# Patient Record
Sex: Female | Born: 1970 | Race: White | Hispanic: No | Marital: Married | Smoking: Never smoker
Health system: Southern US, Community
[De-identification: ages and names within clinical notes are randomized; demographics above are authoritative.]

## PROBLEM LIST (undated history)

## (undated) DIAGNOSIS — I10 Essential (primary) hypertension: Secondary | ICD-10-CM

## (undated) DIAGNOSIS — Z973 Presence of spectacles and contact lenses: Secondary | ICD-10-CM

## (undated) HISTORY — PX: KIDNEY STONE SURGERY: SHX686

## (undated) HISTORY — DX: Essential (primary) hypertension: I10

## (undated) HISTORY — PX: TONSILLECTOMY: SUR1361

## (undated) HISTORY — PX: TUBAL LIGATION: SHX77

---

## 2013-02-17 ENCOUNTER — Other Ambulatory Visit: Payer: Self-pay | Admitting: Nurse Practitioner

## 2013-02-17 DIAGNOSIS — N6320 Unspecified lump in the left breast, unspecified quadrant: Secondary | ICD-10-CM

## 2013-02-25 ENCOUNTER — Ambulatory Visit
Admission: RE | Admit: 2013-02-25 | Discharge: 2013-02-25 | Disposition: A | Discharge status: Home / Self Care | Payer: Commercial Indemnity | Source: Ambulatory Visit | Attending: Nurse Practitioner | Admitting: Nurse Practitioner

## 2013-02-25 ENCOUNTER — Other Ambulatory Visit: Payer: Self-pay | Admitting: Nurse Practitioner

## 2013-02-25 DIAGNOSIS — N6320 Unspecified lump in the left breast, unspecified quadrant: Secondary | ICD-10-CM

## 2013-03-01 ENCOUNTER — Ambulatory Visit
Admission: RE | Admit: 2013-03-01 | Discharge: 2013-03-01 | Disposition: A | Discharge status: Home / Self Care | Payer: Commercial Indemnity | Source: Ambulatory Visit | Attending: Nurse Practitioner | Admitting: Nurse Practitioner

## 2013-03-01 ENCOUNTER — Other Ambulatory Visit: Payer: Self-pay | Admitting: Nurse Practitioner

## 2013-03-01 DIAGNOSIS — N6320 Unspecified lump in the left breast, unspecified quadrant: Secondary | ICD-10-CM

## 2013-03-22 ENCOUNTER — Ambulatory Visit (INDEPENDENT_AMBULATORY_CARE_PROVIDER_SITE_OTHER): Payer: Commercial Indemnity | Admitting: Surgery

## 2013-03-22 ENCOUNTER — Encounter (HOSPITAL_BASED_OUTPATIENT_CLINIC_OR_DEPARTMENT_OTHER): Payer: Self-pay | Admitting: *Deleted

## 2013-03-22 ENCOUNTER — Encounter (INDEPENDENT_AMBULATORY_CARE_PROVIDER_SITE_OTHER): Payer: Self-pay

## 2013-03-22 ENCOUNTER — Encounter (INDEPENDENT_AMBULATORY_CARE_PROVIDER_SITE_OTHER): Payer: Self-pay | Admitting: Surgery

## 2013-03-22 VITALS — BP 120/88 | HR 72 | Temp 98.6°F | Resp 14 | Ht 65.0 in | Wt 162.2 lb

## 2013-03-22 DIAGNOSIS — N6320 Unspecified lump in the left breast, unspecified quadrant: Secondary | ICD-10-CM

## 2013-03-22 DIAGNOSIS — N63 Unspecified lump in unspecified breast: Secondary | ICD-10-CM

## 2013-03-22 NOTE — Progress Notes (Signed)
No labs needed

## 2013-03-22 NOTE — Progress Notes (Addendum)
Patient ID: Kimberly Preston, female   DOB: July 01, 1970, 43 y.o.   MRN: [id number]  Chief Complaint  Patient presents with  . New Evaluation    eval Lf br mass    HPI Kimberly Preston is a 43 y.o. female.   HPI This is a pleasant 43 year old female who is here for evaluation of a left breast mass. She is referred by Dr. Miquel Dunn.  She felt a mass or so for several weeks ago. She has since had a stereotactic biopsy of the mass confirming it to be either an adenoma or a papilloma. Surgical excision has been recommended for histologic evaluation. She is otherwise without complaints. She has had a previous cyst removed from her breast which was benign. She denies nipple discharge. She is otherwise without complaints. Past Medical History  Diagnosis Date  . Hypertension     preclampsy    Past Surgical History  Procedure Laterality Date  . Tonsillectomy    . Cesarean section      2  . Tubal ligation    . Kidney stone surgery      blockage and put stent in and remvoed    Family History  Problem Relation Age of Onset  . Hypertension Mother   . Cancer Mother     skin on face  . Hypertension Father   . Diabetes Maternal Grandmother   . Cancer Maternal Grandmother     skin  . Cancer Maternal Grandfather     skin    Social History History  Substance Use Topics  . Smoking status: Never Smoker   . Smokeless tobacco: Never Used  . Alcohol Use: Yes     Comment: socially    Allergies  Allergen Reactions  . Codeine Nausea And Vomiting    Current Outpatient Prescriptions  Medication Sig Dispense Refill  . oxyCODONE-acetaminophen (PERCOCET/ROXICET) 5-325 MG per tablet Take by mouth every 4 (four) hours as needed for severe pain.      . promethazine (PHENERGAN) 25 MG tablet Take 25 mg by mouth every 6 (six) hours as needed for nausea or vomiting.      . tamsulosin (FLOMAX) 0.4 MG CAPS capsule Take 0.4 mg by mouth.       No current facility-administered medications for this visit.     Review of Systems Review of Systems  Constitutional: Negative for fever, chills and unexpected weight change.  HENT: Negative for congestion, hearing loss, sore throat, trouble swallowing and voice change.   Eyes: Negative for visual disturbance.  Respiratory: Negative for cough and wheezing.   Cardiovascular: Negative for chest pain, palpitations and leg swelling.  Gastrointestinal: Negative for nausea, vomiting, abdominal pain, diarrhea, constipation, blood in stool, abdominal distention and anal bleeding.  Genitourinary: Negative for hematuria, vaginal bleeding and difficulty urinating.  Musculoskeletal: Negative for arthralgias.  Skin: Negative for rash and wound.  Neurological: Negative for seizures, syncope and headaches.  Hematological: Negative for adenopathy. Does not bruise/bleed easily.  Psychiatric/Behavioral: Negative for confusion.    Blood pressure 120/88, pulse 72, temperature 98.6 F (37 C), temperature source Temporal, resp. rate 14, height 5\' 5"  (1.651 m), weight 162 lb 3.2 oz (73.573 kg).  Physical Exam Physical Exam  Constitutional: She is oriented to person, place, and time. She appears well-developed and well-nourished. No distress.  HENT:  Head: Normocephalic and atraumatic.  Right Ear: External ear normal.  Left Ear: External ear normal.  Nose: Nose normal.  Mouth/Throat: Oropharynx is clear and moist. No oropharyngeal exudate.  Eyes: Conjunctivae are  normal. Pupils are equal, round, and reactive to light. Right eye exhibits no discharge. Left eye exhibits no discharge. No scleral icterus.  Neck: Normal range of motion. Neck supple. No tracheal deviation present.  Cardiovascular: Normal rate, regular rhythm, normal heart sounds and intact distal pulses.   No murmur heard. Pulmonary/Chest: Effort normal and breath sounds normal. No respiratory distress. She has no wheezes.  Lymphadenopathy:    She has no cervical adenopathy.    She has no axillary  adenopathy.  Neurological: She is alert and oriented to person, place, and time.  Skin: Skin is warm and dry. She is not diaphoretic. No erythema.  Psychiatric: Her behavior is normal. Judgment normal.  Breast: There is ecchymosis of the left breast from a previous biopsy. In the area of a concerning mass, I cannot palpate a mass. The patient reports that she could previously palpate the mass a cystoscopy biopsy can no longer feel it.  Data Reviewed I reviewed the mammograms and pathology  Assessment    Left breast mass     Plan    Again, surgical excision of this is recommended. As this is not palpable, this would need to be done under needle localization. I explained this to her in detail. I discussed the risk of surgery which includes but is not limited to bleeding, infection, injury to shredding structures, need for further surgery should malignancy be found, et Ronney Asters. Should she be able to feel the mass again preoperatively, she will let me know and we may be able To forego the needle localization. Surgery is scheduled        Brandy Zuba A 03/22/2013, 10:22 AM

## 2013-03-25 ENCOUNTER — Ambulatory Visit
Admission: RE | Admit: 2013-03-25 | Discharge: 2013-03-25 | Disposition: A | Discharge status: Home / Self Care | Payer: Commercial Indemnity | Source: Ambulatory Visit | Attending: Surgery | Admitting: Surgery

## 2013-03-25 ENCOUNTER — Ambulatory Visit (HOSPITAL_BASED_OUTPATIENT_CLINIC_OR_DEPARTMENT_OTHER): Payer: Commercial Indemnity | Admitting: Anesthesiology

## 2013-03-25 ENCOUNTER — Ambulatory Visit (HOSPITAL_BASED_OUTPATIENT_CLINIC_OR_DEPARTMENT_OTHER)
Admission: RE | Admit: 2013-03-25 | Discharge: 2013-03-25 | Disposition: A | Discharge status: Home / Self Care | Payer: Commercial Indemnity | Source: Ambulatory Visit | Attending: Surgery | Admitting: Surgery

## 2013-03-25 ENCOUNTER — Encounter (HOSPITAL_BASED_OUTPATIENT_CLINIC_OR_DEPARTMENT_OTHER)
Admission: RE | Disposition: A | Discharge status: Home / Self Care | Payer: Self-pay | Source: Ambulatory Visit | Attending: Surgery

## 2013-03-25 ENCOUNTER — Encounter (HOSPITAL_BASED_OUTPATIENT_CLINIC_OR_DEPARTMENT_OTHER): Payer: Self-pay

## 2013-03-25 ENCOUNTER — Encounter (HOSPITAL_BASED_OUTPATIENT_CLINIC_OR_DEPARTMENT_OTHER): Payer: Commercial Indemnity | Admitting: Anesthesiology

## 2013-03-25 DIAGNOSIS — N6320 Unspecified lump in the left breast, unspecified quadrant: Secondary | ICD-10-CM

## 2013-03-25 DIAGNOSIS — D249 Benign neoplasm of unspecified breast: Secondary | ICD-10-CM

## 2013-03-25 DIAGNOSIS — R920 Mammographic microcalcification found on diagnostic imaging of breast: Secondary | ICD-10-CM

## 2013-03-25 DIAGNOSIS — I10 Essential (primary) hypertension: Secondary | ICD-10-CM | POA: Insufficient documentation

## 2013-03-25 HISTORY — PX: BREAST LUMPECTOMY WITH NEEDLE LOCALIZATION: SHX5759

## 2013-03-25 HISTORY — DX: Presence of spectacles and contact lenses: Z97.3

## 2013-03-25 LAB — POCT HEMOGLOBIN-HEMACUE: Hemoglobin: 15 g/dL (ref 12.0–15.0)

## 2013-03-25 SURGERY — BREAST LUMPECTOMY WITH NEEDLE LOCALIZATION
Anesthesia: General | Site: Breast | Laterality: Left

## 2013-03-25 MED ORDER — FENTANYL CITRATE 0.05 MG/ML IJ SOLN
INTRAMUSCULAR | Status: AC
  Filled 2013-03-25: qty 6

## 2013-03-25 MED ORDER — LIDOCAINE HCL (PF) 1 % IJ SOLN
INTRAMUSCULAR | Status: AC
  Filled 2013-03-25: qty 30

## 2013-03-25 MED ORDER — LACTATED RINGERS IV SOLN
INTRAVENOUS | Status: DC
Start: 2013-03-25 — End: 2013-03-25

## 2013-03-25 MED ORDER — CEFAZOLIN SODIUM-DEXTROSE 2-3 GM-% IV SOLR
INTRAVENOUS | Status: AC
  Filled 2013-03-25: qty 50

## 2013-03-25 MED ORDER — FENTANYL CITRATE 0.05 MG/ML IJ SOLN: 50.0000 ug | INTRAMUSCULAR | Status: DC | PRN

## 2013-03-25 MED ORDER — DEXAMETHASONE SODIUM PHOSPHATE 4 MG/ML IJ SOLN
INTRAMUSCULAR | Status: DC | PRN
  Administered 2013-03-25: 10 mg via INTRAVENOUS

## 2013-03-25 MED ORDER — ACETAMINOPHEN 325 MG PO TABS: 650.0000 mg | ORAL_TABLET | ORAL | Status: DC | PRN

## 2013-03-25 MED ORDER — MORPHINE SULFATE 4 MG/ML IJ SOLN: 4.0000 mg | INTRAMUSCULAR | Status: DC | PRN

## 2013-03-25 MED ORDER — OXYCODONE HCL 5 MG PO TABS: 5.0000 mg | ORAL_TABLET | ORAL | Status: DC | PRN

## 2013-03-25 MED ORDER — OXYCODONE-ACETAMINOPHEN 5-325 MG PO TABS: 1.0000 | ORAL_TABLET | ORAL | Status: AC | PRN

## 2013-03-25 MED ORDER — SODIUM CHLORIDE 0.9 % IV SOLN: 250.0000 mL | INTRAVENOUS | Status: DC | PRN

## 2013-03-25 MED ORDER — LIDOCAINE HCL (CARDIAC) 20 MG/ML IV SOLN
INTRAVENOUS | Status: DC | PRN
  Administered 2013-03-25: 75 mg via INTRAVENOUS

## 2013-03-25 MED ORDER — SODIUM CHLORIDE 0.9 % IJ SOLN: 3.0000 mL | INTRAMUSCULAR | Status: DC | PRN

## 2013-03-25 MED ORDER — CEFAZOLIN SODIUM-DEXTROSE 2-3 GM-% IV SOLR
2.0000 g | INTRAVENOUS | Status: AC
Start: 2013-03-25 — End: 2013-03-25
  Administered 2013-03-25: 2 g via INTRAVENOUS

## 2013-03-25 MED ORDER — PROPOFOL 10 MG/ML IV EMUL
INTRAVENOUS | Status: AC
  Filled 2013-03-25: qty 100

## 2013-03-25 MED ORDER — LACTATED RINGERS IV SOLN
INTRAVENOUS | Status: DC | PRN
  Administered 2013-03-25: 09:00:00 via INTRAVENOUS

## 2013-03-25 MED ORDER — ONDANSETRON HCL 4 MG/2ML IJ SOLN: 4.0000 mg | Freq: Four times a day (QID) | INTRAMUSCULAR | Status: DC | PRN

## 2013-03-25 MED ORDER — KETOROLAC TROMETHAMINE 30 MG/ML IJ SOLN
INTRAMUSCULAR | Status: DC | PRN
  Administered 2013-03-25: 30 mg via INTRAVENOUS

## 2013-03-25 MED ORDER — PROPOFOL 10 MG/ML IV BOLUS
INTRAVENOUS | Status: DC | PRN
  Administered 2013-03-25: 200 mg via INTRAVENOUS

## 2013-03-25 MED ORDER — ACETAMINOPHEN 650 MG RE SUPP: 650.0000 mg | RECTAL | Status: DC | PRN

## 2013-03-25 MED ORDER — BUPIVACAINE-EPINEPHRINE PF 0.5-1:200000 % IJ SOLN
INTRAMUSCULAR | Status: AC
  Filled 2013-03-25: qty 30

## 2013-03-25 MED ORDER — BACITRACIN-NEOMYCIN-POLYMYXIN 400-5-5000 EX OINT
TOPICAL_OINTMENT | CUTANEOUS | Status: AC
  Filled 2013-03-25: qty 1

## 2013-03-25 MED ORDER — MIDAZOLAM HCL 2 MG/2ML IJ SOLN: 1.0000 mg | INTRAMUSCULAR | Status: DC | PRN

## 2013-03-25 MED ORDER — MIDAZOLAM HCL 2 MG/2ML IJ SOLN
INTRAMUSCULAR | Status: AC
  Filled 2013-03-25: qty 2

## 2013-03-25 MED ORDER — FENTANYL CITRATE 0.05 MG/ML IJ SOLN
INTRAMUSCULAR | Status: DC | PRN
  Administered 2013-03-25: 100 ug via INTRAVENOUS

## 2013-03-25 MED ORDER — ONDANSETRON HCL 4 MG/2ML IJ SOLN
INTRAMUSCULAR | Status: DC | PRN
  Administered 2013-03-25: 4 mg via INTRAVENOUS

## 2013-03-25 MED ORDER — BUPIVACAINE-EPINEPHRINE 0.5% -1:200000 IJ SOLN
INTRAMUSCULAR | Status: DC | PRN
  Administered 2013-03-25: 10 mL

## 2013-03-25 MED ORDER — SODIUM CHLORIDE 0.9 % IJ SOLN: 3.0000 mL | Freq: Two times a day (BID) | INTRAMUSCULAR | Status: DC

## 2013-03-25 MED ORDER — SODIUM BICARBONATE 4 % IV SOLN
INTRAVENOUS | Status: AC
  Filled 2013-03-25: qty 5

## 2013-03-25 MED ORDER — SUCCINYLCHOLINE CHLORIDE 20 MG/ML IJ SOLN
INTRAMUSCULAR | Status: AC
  Filled 2013-03-25: qty 1

## 2013-03-25 MED ORDER — LACTATED RINGERS IV SOLN
INTRAVENOUS | Status: DC
  Administered 2013-03-25: 09:00:00 via INTRAVENOUS

## 2013-03-25 MED ORDER — MIDAZOLAM HCL 5 MG/5ML IJ SOLN
INTRAMUSCULAR | Status: DC | PRN
  Administered 2013-03-25: 2 mg via INTRAVENOUS

## 2013-03-25 SURGICAL SUPPLY — 45 items
APL SKNCLS STERI-STRIP NONHPOA (GAUZE/BANDAGES/DRESSINGS) ×1
BENZOIN TINCTURE PRP APPL 2/3 (GAUZE/BANDAGES/DRESSINGS) ×3 IMPLANT
BLADE HEX COATED 2.75 (ELECTRODE) ×3 IMPLANT
BLADE SURG 15 STRL LF DISP TIS (BLADE) ×1 IMPLANT
BLADE SURG 15 STRL SS (BLADE) ×3
CANISTER SUCT 1200ML W/VALVE (MISCELLANEOUS) IMPLANT
CHLORAPREP W/TINT 26ML (MISCELLANEOUS) ×3 IMPLANT
CLIP TI WIDE RED SMALL 6 (CLIP) IMPLANT
CLOSURE WOUND 1/2 X4 (GAUZE/BANDAGES/DRESSINGS) ×1
COVER MAYO STAND STRL (DRAPES) ×3 IMPLANT
COVER TABLE BACK 60X90 (DRAPES) ×3 IMPLANT
DECANTER SPIKE VIAL GLASS SM (MISCELLANEOUS) IMPLANT
DEVICE DUBIN W/COMP PLATE 8390 (MISCELLANEOUS) ×3 IMPLANT
DRAPE PED LAPAROTOMY (DRAPES) ×3 IMPLANT
DRAPE UTILITY XL STRL (DRAPES) ×3 IMPLANT
DRSG TEGADERM 4X4.75 (GAUZE/BANDAGES/DRESSINGS) ×3 IMPLANT
ELECT REM PT RETURN 9FT ADLT (ELECTROSURGICAL) ×3
ELECTRODE REM PT RTRN 9FT ADLT (ELECTROSURGICAL) ×1 IMPLANT
GLOVE BIO SURGEON STRL SZ7 (GLOVE) ×3 IMPLANT
GLOVE BIOGEL PI IND STRL 7.0 (GLOVE) ×1 IMPLANT
GLOVE BIOGEL PI INDICATOR 7.0 (GLOVE) ×2
GLOVE SURG SIGNA 7.5 PF LTX (GLOVE) ×3 IMPLANT
GOWN STRL REUS W/ TWL LRG LVL3 (GOWN DISPOSABLE) ×1 IMPLANT
GOWN STRL REUS W/ TWL XL LVL3 (GOWN DISPOSABLE) ×1 IMPLANT
GOWN STRL REUS W/TWL LRG LVL3 (GOWN DISPOSABLE) ×3
GOWN STRL REUS W/TWL XL LVL3 (GOWN DISPOSABLE) ×3
KIT MARKER MARGIN INK (KITS) ×3 IMPLANT
NEEDLE HYPO 25X1 1.5 SAFETY (NEEDLE) ×3 IMPLANT
NS IRRIG 1000ML POUR BTL (IV SOLUTION) ×3 IMPLANT
PACK BASIN DAY SURGERY FS (CUSTOM PROCEDURE TRAY) ×3 IMPLANT
PENCIL BUTTON HOLSTER BLD 10FT (ELECTRODE) ×3 IMPLANT
SLEEVE SCD COMPRESS KNEE MED (MISCELLANEOUS) ×3 IMPLANT
SPONGE GAUZE 4X4 12PLY STER LF (GAUZE/BANDAGES/DRESSINGS) ×3 IMPLANT
SPONGE LAP 4X18 X RAY DECT (DISPOSABLE) ×3 IMPLANT
STRIP CLOSURE SKIN 1/2X4 (GAUZE/BANDAGES/DRESSINGS) ×2 IMPLANT
SUT MNCRL AB 4-0 PS2 18 (SUTURE) ×3 IMPLANT
SUT SILK 2 0 SH (SUTURE) IMPLANT
SUT VIC AB 3-0 SH 27 (SUTURE) ×3
SUT VIC AB 3-0 SH 27X BRD (SUTURE) ×1 IMPLANT
SYR CONTROL 10ML LL (SYRINGE) ×3 IMPLANT
TOWEL OR 17X24 6PK STRL BLUE (TOWEL DISPOSABLE) ×3 IMPLANT
TOWEL OR NON WOVEN STRL DISP B (DISPOSABLE) ×3 IMPLANT
TUBE CONNECTING 20'X1/4 (TUBING)
TUBE CONNECTING 20X1/4 (TUBING) IMPLANT
YANKAUER SUCT BULB TIP NO VENT (SUCTIONS) IMPLANT

## 2013-03-25 NOTE — H&P (Signed)
Patient ID: Kimberly Preston, female DOB: 05-Nov-1970, 43 y.o. MRN: [id number]  Chief Complaint   Patient presents with   .  New Evaluation     eval Lf br mass   HPI  Kimberly Preston is a 43 y.o. female.  HPI  This is a pleasant 43 year old female who is here for evaluation of a left breast mass. She is referred by Dr. Miquel Dunn. She felt a mass or so for several weeks ago. She has since had a stereotactic biopsy of the mass confirming it to be either an adenoma or a papilloma. Surgical excision has been recommended for histologic evaluation. She is otherwise without complaints. She has had a previous cyst removed from her breast which was benign. She denies nipple discharge. She is otherwise without complaints.  Past Medical History   Diagnosis  Date   .  Hypertension      preclampsy    Past Surgical History   Procedure  Laterality  Date   .  Tonsillectomy     .  Cesarean section       2   .  Tubal ligation     .  Kidney stone surgery       blockage and put stent in and remvoed    Family History   Problem  Relation  Age of Onset   .  Hypertension  Mother    .  Cancer  Mother      skin on face   .  Hypertension  Father    .  Diabetes  Maternal Grandmother    .  Cancer  Maternal Grandmother      skin   .  Cancer  Maternal Grandfather      skin   Social History  History   Substance Use Topics   .  Smoking status:  Never Smoker   .  Smokeless tobacco:  Never Used   .  Alcohol Use:  Yes      Comment: socially    Allergies   Allergen  Reactions   .  Codeine  Nausea And Vomiting    Current Outpatient Prescriptions   Medication  Sig  Dispense  Refill   .  oxyCODONE-acetaminophen (PERCOCET/ROXICET) 5-325 MG per tablet  Take by mouth every 4 (four) hours as needed for severe pain.     .  promethazine (PHENERGAN) 25 MG tablet  Take 25 mg by mouth every 6 (six) hours as needed for nausea or vomiting.     .  tamsulosin (FLOMAX) 0.4 MG CAPS capsule  Take 0.4 mg by mouth.      No  current facility-administered medications for this visit.   Review of Systems  Review of Systems  Constitutional: Negative for fever, chills and unexpected weight change.  HENT: Negative for congestion, hearing loss, sore throat, trouble swallowing and voice change.  Eyes: Negative for visual disturbance.  Respiratory: Negative for cough and wheezing.  Cardiovascular: Negative for chest pain, palpitations and leg swelling.  Gastrointestinal: Negative for nausea, vomiting, abdominal pain, diarrhea, constipation, blood in stool, abdominal distention and anal bleeding.  Genitourinary: Negative for hematuria, vaginal bleeding and difficulty urinating.  Musculoskeletal: Negative for arthralgias.  Skin: Negative for rash and wound.  Neurological: Negative for seizures, syncope and headaches.  Hematological: Negative for adenopathy. Does not bruise/bleed easily.  Psychiatric/Behavioral: Negative for confusion.  Blood pressure 120/88, pulse 72, temperature 98.6 F (37 C), temperature source Temporal, resp. rate 14, height 5\' 5"  (1.651 m), weight 162 lb 3.2 oz (  73.573 kg).  Physical Exam  Physical Exam  Constitutional: She is oriented to person, place, and time. She appears well-developed and well-nourished. No distress.  HENT:  Head: Normocephalic and atraumatic.  Right Ear: External ear normal.  Left Ear: External ear normal.  Nose: Nose normal.  Mouth/Throat: Oropharynx is clear and moist. No oropharyngeal exudate.  Eyes: Conjunctivae are normal. Pupils are equal, round, and reactive to light. Right eye exhibits no discharge. Left eye exhibits no discharge. No scleral icterus.  Neck: Normal range of motion. Neck supple. No tracheal deviation present.  Cardiovascular: Normal rate, regular rhythm, normal heart sounds and intact distal pulses.  No murmur heard.  Pulmonary/Chest: Effort normal and breath sounds normal. No respiratory distress. She has no wheezes.  Lymphadenopathy:  She has no  cervical adenopathy.  She has no axillary adenopathy.  Neurological: She is alert and oriented to person, place, and time.  Skin: Skin is warm and dry. She is not diaphoretic. No erythema.  Psychiatric: Her behavior is normal. Judgment normal.  Breast: There is ecchymosis of the left breast from a previous biopsy. In the area of a concerning mass, I cannot palpate a mass. The patient reports that she could previously palpate the mass a cystoscopy biopsy can no longer feel it.  Data Reviewed  I reviewed the mammograms and pathology  Assessment  Left breast mass  Plan  Again, surgical excision of this is recommended. As this is not palpable, this would need to be done under needle localization. I explained this to her in detail. I discussed the risk of surgery which includes but is not limited to bleeding, infection, injury to shredding structures, need for further surgery should malignancy be found, et Ronney Asters. Should she be able to feel the mass again preoperatively, she will let me know and we may be able To forego the needle localization. Surgery is scheduled

## 2013-03-25 NOTE — Anesthesia Preprocedure Evaluation (Addendum)
Anesthesia Evaluation  Patient identified by MRN, date of birth, ID band Patient awake    Reviewed: Allergy & Precautions, H&P , NPO status , Patient's Chart, lab work & pertinent test results  Airway Mallampati: I TM Distance: >3 FB Neck ROM: Full    Dental no notable dental hx. (+) Teeth Intact and Dental Advisory Given   Pulmonary neg pulmonary ROS,  breath sounds clear to auscultation  Pulmonary exam normal       Cardiovascular hypertension, Rhythm:Regular Rate:Normal     Neuro/Psych negative neurological ROS  negative psych ROS   GI/Hepatic negative GI ROS, Neg liver ROS,   Endo/Other  negative endocrine ROS  Renal/GU negative Renal ROS  negative genitourinary   Musculoskeletal   Abdominal   Peds  Hematology negative hematology ROS (+)   Anesthesia Other Findings   Reproductive/Obstetrics negative OB ROS                          Anesthesia Physical Anesthesia Plan  ASA: II  Anesthesia Plan: General   Post-op Pain Management:    Induction: Intravenous  Airway Management Planned: LMA  Additional Equipment:   Intra-op Plan:   Post-operative Plan: Extubation in OR  Informed Consent: I have reviewed the patients History and Physical, chart, labs and discussed the procedure including the risks, benefits and alternatives for the proposed anesthesia with the patient or authorized representative who has indicated his/her understanding and acceptance.   Dental advisory given  Plan Discussed with: CRNA  Anesthesia Plan Comments:         Anesthesia Quick Evaluation

## 2013-03-25 NOTE — Discharge Instructions (Signed)
[facility] °Office Phone Number [phone] ° °BREAST BIOPSY/ PARTIAL MASTECTOMY: POST OP INSTRUCTIONS ° °Always review your discharge instruction sheet given to you by the facility where your surgery was performed. ° °IF YOU HAVE DISABILITY OR FAMILY LEAVE FORMS, YOU MUST BRING THEM TO THE OFFICE FOR PROCESSING.  DO NOT GIVE THEM TO YOUR DOCTOR. ° °1. A prescription for pain medication may be given to you upon discharge.  Take your pain medication as prescribed, if needed.  If narcotic pain medicine is not needed, then you may take acetaminophen (Tylenol) or ibuprofen (Advil) as needed. °2. Take your usually prescribed medications unless otherwise directed °3. If you need a refill on your pain medication, please contact your pharmacy.  They will contact our office to request authorization.  Prescriptions will not be filled after 5pm or on week-ends. °4. You should eat very light the first 24 hours after surgery, such as soup, crackers, pudding, etc.  Resume your normal diet the day after surgery. °5. Most patients will experience some swelling and bruising in the breast.  Ice packs and a good support bra will help.  Swelling and bruising can take several days to resolve.  °6. It is common to experience some constipation if taking pain medication after surgery.  Increasing fluid intake and taking a stool softener will usually help or prevent this problem from occurring.  A mild laxative (Milk of Magnesia or Miralax) should be taken according to package directions if there are no bowel movements after 48 hours. °7. Unless discharge instructions indicate otherwise, you may remove your bandages 24-48 hours after surgery, and you may shower at that time.  You may have steri-strips (small skin tapes) in place directly over the incision.  These strips should be left on the skin for 7-10 days.  If your surgeon used skin glue on the incision, you may shower in 24 hours.  The glue will flake off over the  next 2-3 weeks.  Any sutures or staples will be removed at the office during your follow-up visit. °8. ACTIVITIES:  You may resume regular daily activities (gradually increasing) beginning the next day.  Wearing a good support bra or sports bra minimizes pain and swelling.  You may have sexual intercourse when it is comfortable. °a. You may drive when you no longer are taking prescription pain medication, you can comfortably wear a seatbelt, and you can safely maneuver your car and apply brakes. °b. RETURN TO WORK:  ______________________________________________________________________________________ °9. You should see your doctor in the office for a follow-up appointment approximately two weeks after your surgery.  Your doctor’s nurse will typically make your follow-up appointment when she calls you with your pathology report.  Expect your pathology report 2-3 business days after your surgery.  You may call to check if you do not hear from us after three days. °10. OTHER INSTRUCTIONS: _______________________________________________________________________________________________ _____________________________________________________________________________________________________________________________________ °_____________________________________________________________________________________________________________________________________ °_____________________________________________________________________________________________________________________________________ ° °WHEN TO CALL YOUR DOCTOR: °1. Fever over 101.0 °2. Nausea and/or vomiting. °3. Extreme swelling or bruising. °4. Continued bleeding from incision. °5. Increased pain, redness, or drainage from the incision. ° °The clinic staff is available to answer your questions during regular business hours.  Please don’t hesitate to call and ask to speak to one of the nurses for clinical concerns.  If you have a medical emergency, go to the nearest  emergency room or call 911.  A surgeon from [facility] is always on call at the hospital. ° °For further questions, please visit [email or web address]  ° ° °  Post Anesthesia Home Care Instructions ° °Activity: °Get plenty of rest for the remainder of the day. A responsible adult should stay with you for 24 hours following the procedure.  °For the next 24 hours, DO NOT: °-Drive a car °-Operate machinery °-Drink alcoholic beverages °-Take any medication unless instructed by your physician °-Make any legal decisions or sign important papers. ° °Meals: °Start with liquid foods such as gelatin or soup. Progress to regular foods as tolerated. Avoid greasy, spicy, heavy foods. If nausea and/or vomiting occur, drink only clear liquids until the nausea and/or vomiting subsides. Call your physician if vomiting continues. ° °Special Instructions/Symptoms: °Your throat may feel dry or sore from the anesthesia or the breathing tube placed in your throat during surgery. If this causes discomfort, gargle with warm salt water. The discomfort should disappear within 24 hours. ° °

## 2013-03-25 NOTE — Anesthesia Postprocedure Evaluation (Signed)
  Anesthesia Post-op Note  Patient: Kimberly Preston  Procedure(s) Performed: Procedure(s): BREAST LUMPECTOMY WITH NEEDLE LOCALIZATION (Left)  Patient Location: PACU  Anesthesia Type:General  Level of Consciousness: awake and alert   Airway and Oxygen Therapy: Patient Spontanous Breathing  Post-op Pain: mild  Post-op Assessment: Post-op Vital signs reviewed, Patient's Cardiovascular Status Stable and Respiratory Function Stable  Post-op Vital Signs: Reviewed  Filed Vitals:   03/25/13 1045  BP: 142/94  Pulse: 86  Temp:   Resp: 10    Complications: No apparent anesthesia complications

## 2013-03-25 NOTE — Transfer of Care (Signed)
Immediate Anesthesia Transfer of Care Note  Patient: Kimberly Preston  Procedure(s) Performed: Procedure(s): BREAST LUMPECTOMY WITH NEEDLE LOCALIZATION (Left)  Patient Location: PACU  Anesthesia Type:General  Level of Consciousness: awake, alert  and oriented  Airway & Oxygen Therapy: Patient Spontanous Breathing and Patient connected to face mask oxygen  Post-op Assessment: Report given to PACU RN and Post -op Vital signs reviewed and stable  Post vital signs: Reviewed and stable  Complications: No apparent anesthesia complications

## 2013-03-25 NOTE — Op Note (Signed)
BREAST LUMPECTOMY WITH NEEDLE LOCALIZATION  Procedure Note  Kimberly Preston 03/25/2013   Pre-op Diagnosis: left breast mass     Post-op Diagnosis: same  Procedure(s): BREAST LUMPECTOMY WITH NEEDLE LOCALIZATION  Surgeon(s): Harl Bowie, MD  Anesthesia: Choice  Staff:  Circulator: Timmie Foerster Ward, RN Scrub Person: Josie Dixon, RN  Estimated Blood Loss: Minimal               Specimens: sent to path          [facility]   Date: 03/25/2013  Time: 10:16 AM

## 2013-03-25 NOTE — Anesthesia Procedure Notes (Signed)
Procedure Name: LMA Insertion Date/Time: 03/25/2013 9:43 AM Performed by: Melynda Ripple D Pre-anesthesia Checklist: Patient identified, Emergency Drugs available, Suction available and Patient being monitored Patient Re-evaluated:Patient Re-evaluated prior to inductionOxygen Delivery Method: Circle System Utilized Preoxygenation: Pre-oxygenation with 100% oxygen Intubation Type: IV induction Ventilation: Mask ventilation without difficulty LMA: LMA inserted LMA Size: 4.0 Number of attempts: 1 Airway Equipment and Method: bite block Placement Confirmation: positive ETCO2 Tube secured with: Tape Dental Injury: Teeth and Oropharynx as per pre-operative assessment

## 2013-03-26 ENCOUNTER — Encounter (HOSPITAL_BASED_OUTPATIENT_CLINIC_OR_DEPARTMENT_OTHER): Payer: Self-pay | Admitting: Surgery

## 2013-03-26 NOTE — Op Note (Signed)
NAMELEVERN, KALKA             ACCOUNT NO.:  [id number]  MEDICAL RECORD NO.:  [id number]  LOCATION:                               FACILITY:  [facility]  PHYSICIAN:  Coralie Keens, M.D. DATE OF BIRTH:  12/27/1970  DATE OF PROCEDURE:  03/25/2013 DATE OF DISCHARGE:  03/25/2013                              OPERATIVE REPORT   PREOPERATIVE DIAGNOSIS:  Left breast mass.  POSTOPERATIVE DIAGNOSIS:  Left breast mass.  PROCEDURE:  Needle localized left breast lumpectomy.  SURGEON:  Coralie Keens, MD  ANESTHESIA:  General and 0.5% Marcaine.  ESTIMATED BLOOD LOSS:  Minimal.  INDICATIONS:  This is a 43 year old female who was found to have an abnormal mass on recent mammography.  A stereotactic biopsy showed this to be a papilloma or adenoma.  The decision was then made to proceed with excision of the mass for histologic evaluation.  PROCEDURE IN DETAIL:  The patient was brought to the operating room and identified as Renato Battles.  She had already had a localization wire placed in the left breast at the [facility].  She was placed supine on the operating room table and general anesthesia was induced.  Her left breast was then prepped and draped in usual sterile fashion.  I anesthetized the skin to the medial edge of the areola with Marcaine.  I then made a circumareolar incision with a scalpel.  I took this down to the breast tissue and then pulled the localization wire into the incision.  I then performed a wide lumpectomy around the localization wire going down into the depths of the breast tissue with the electrocautery.  Wide margins appeared to be achieved around the localization wire.  Once the specimen was removed, it was painted in all quadrants with paint for pathologic identification.  X-ray then confirmed that the suspicious area and clips were in the biopsy specimen.  The specimen was then sent to pathology for evaluation.  I anesthetized the surrounding tissue  further with Marcaine.  Hemostasis was achieved with cautery.  I closed the subcutaneous tissue with interrupted 3-0 Vicryl sutures and closed the skin with a running 4-0 Monocryl.  Steri- Strips, gauze, and Tegaderm were then applied.  The patient tolerated the procedure well.  All counts were correct at the end of the procedure.  The patient was then extubated in the operating room and taken in stable condition to recovery room.     Coralie Keens, M.D.     DB/MEDQ  D:  03/25/2013  T:  03/26/2013  Job:  [id number]

## 2013-04-15 ENCOUNTER — Ambulatory Visit (INDEPENDENT_AMBULATORY_CARE_PROVIDER_SITE_OTHER): Payer: Commercial Indemnity | Admitting: Surgery

## 2013-04-15 ENCOUNTER — Encounter (INDEPENDENT_AMBULATORY_CARE_PROVIDER_SITE_OTHER): Payer: Self-pay | Admitting: Surgery

## 2013-04-15 VITALS — BP 140/60 | HR 72 | Resp 16 | Ht 65.0 in | Wt 166.0 lb

## 2013-04-15 DIAGNOSIS — Z09 Encounter for follow-up examination after completed treatment for conditions other than malignant neoplasm: Secondary | ICD-10-CM

## 2013-04-15 NOTE — Progress Notes (Signed)
Subjective:     Patient ID: Kimberly Preston, female   DOB: 1971/01/28, 43 y.o.   MRN: [id number]  HPI She is here for her first postop visit status post left breast lumpectomy. She is doing well and has no complaints.  Review of Systems     Objective:   Physical Exam On exam, her incision is healed well.  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 I will see her back as needed

## 2015-10-16 IMAGING — MG MM DIGITAL DIAGNOSTIC BILAT CAD
5 series · 5 of 5 positions shown · non-contrast
Comparison: 05/22/2011, 10/12/2009.

CLINICAL DATA: Palpable lump in the lower inner periareolar left
breast. Annual evaluation, right breast.

EXAM:
DIGITAL DIAGNOSTIC  BILATERAL MAMMOGRAM WITH CAD
DIGITAL BREAST TOMOSYNTHESIS
Digital breast tomosynthesis images are acquired in two projections.
These images are reviewed in combination with the digital mammogram,
confirming the findings below.
ULTRASOUND LEFT BREAST

[L TAN]
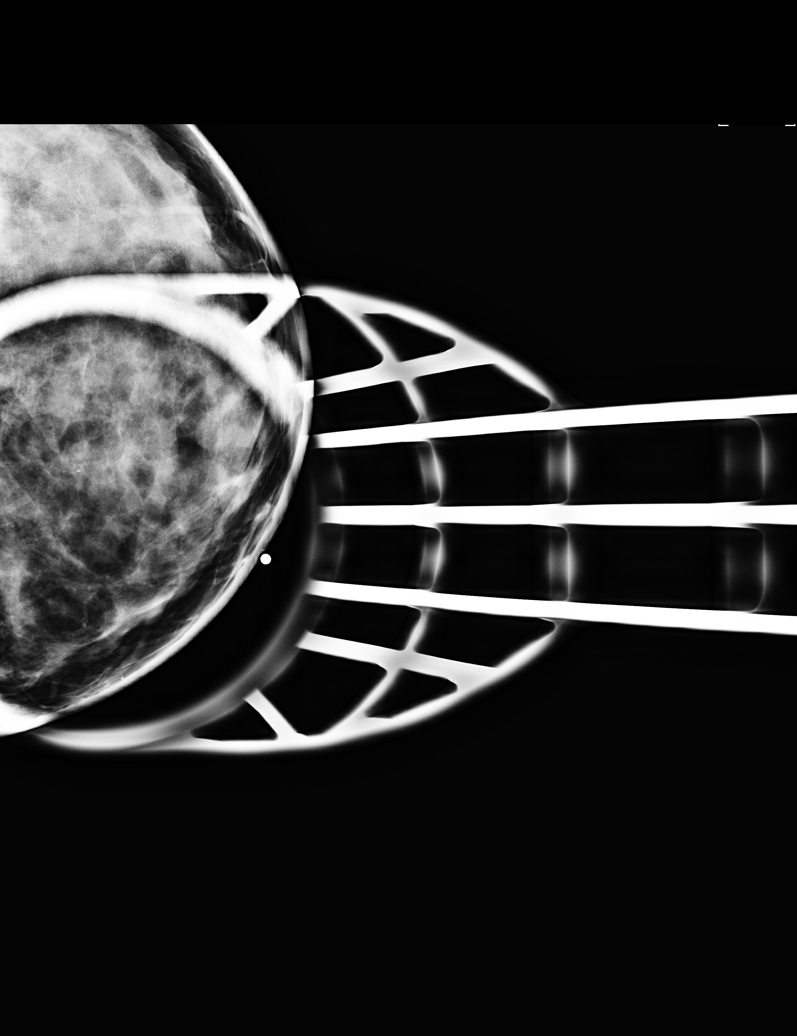

[L MLO]
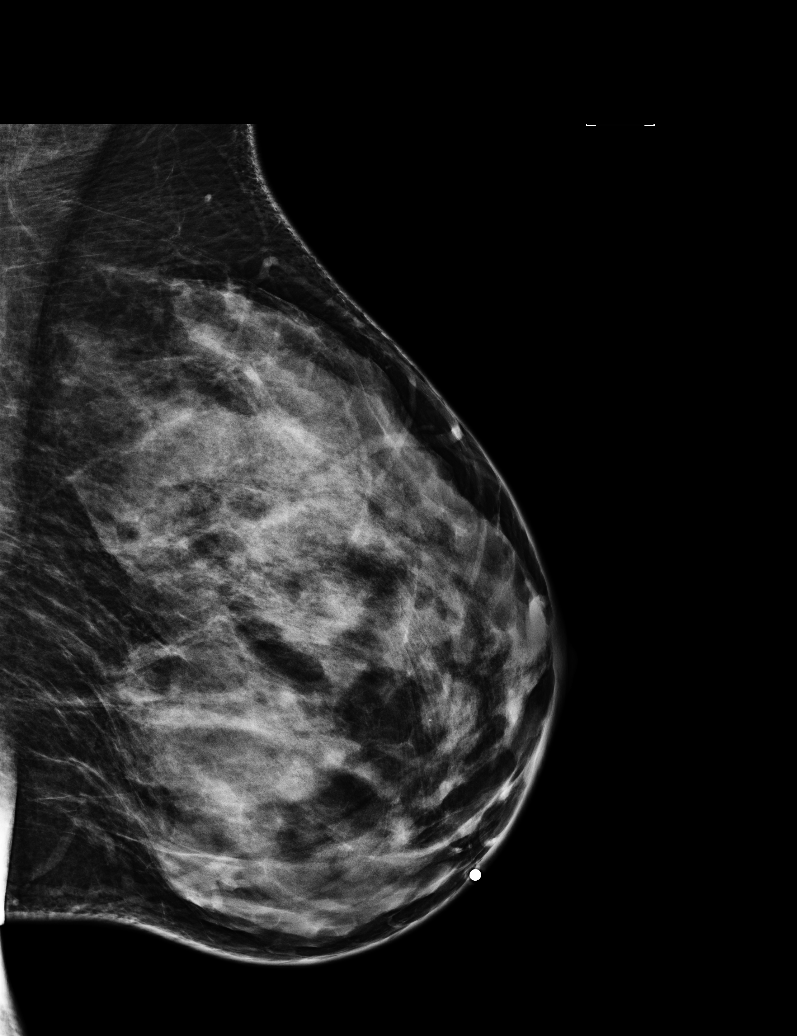

[L CC]
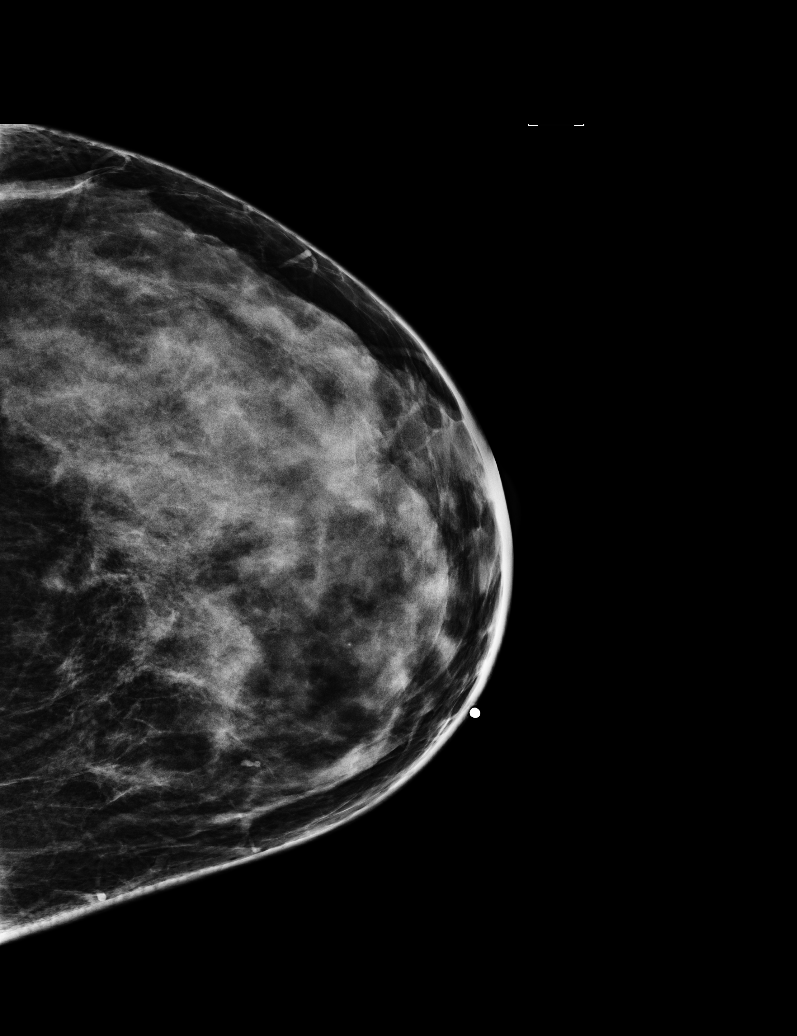

[R CC]
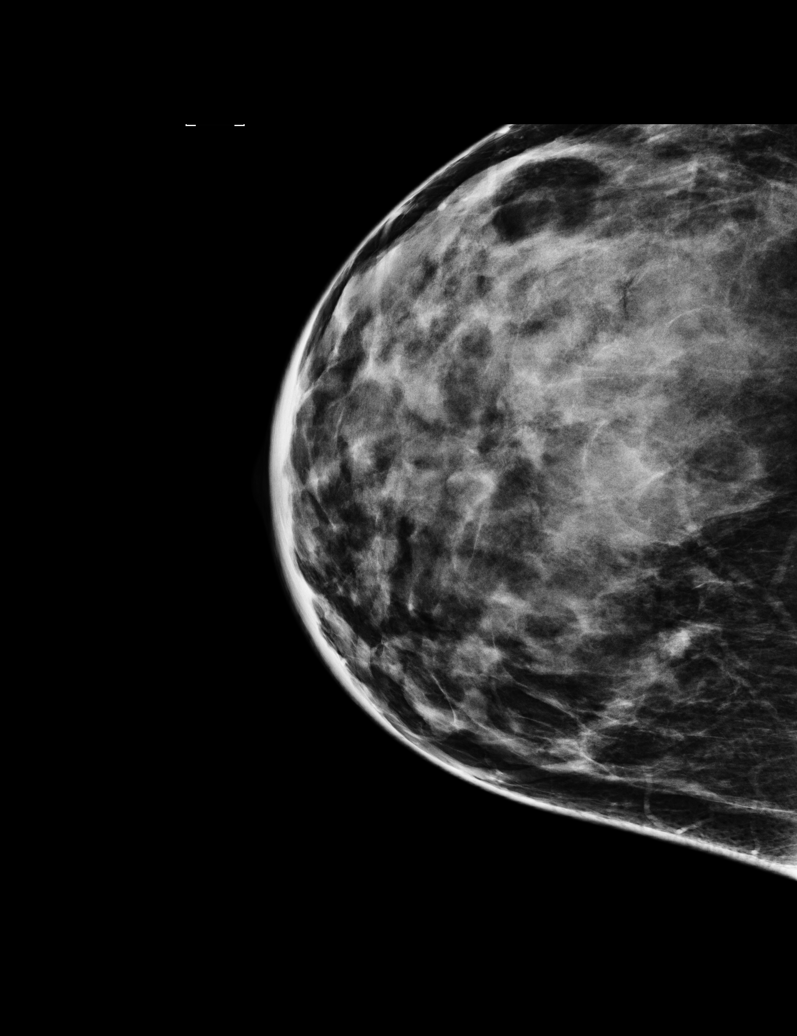

[R MLO]
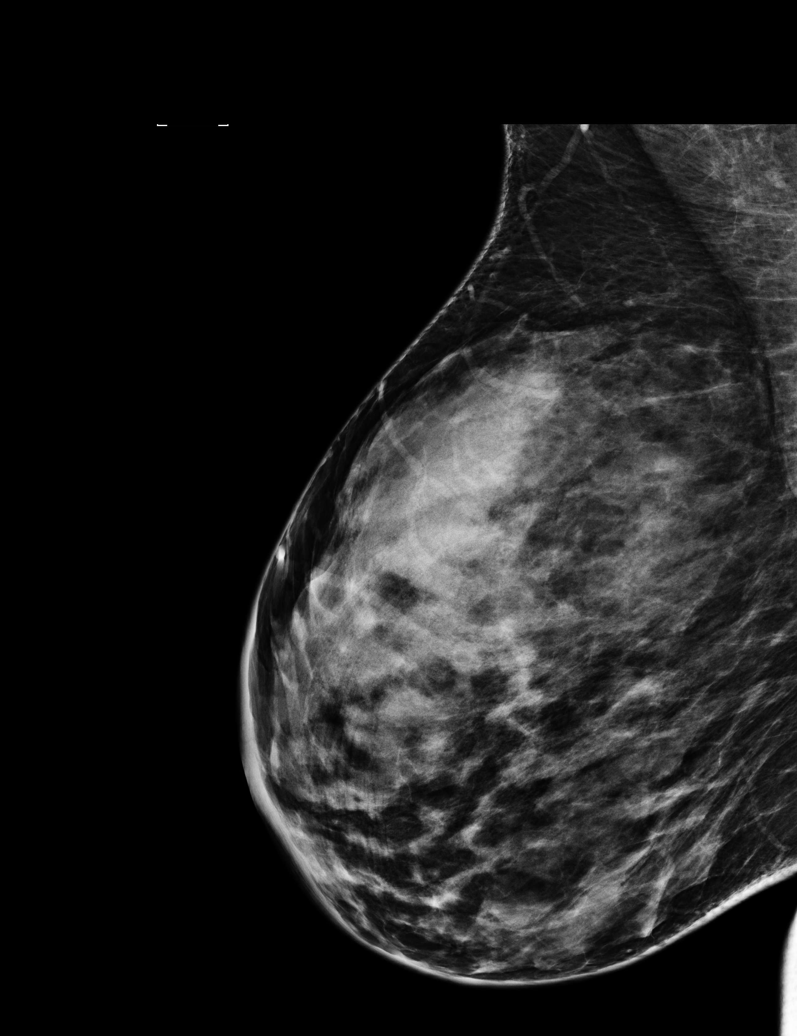

[5 of 5 positions shown; findings below may reference images not displayed]

ACR Breast Density Category d: The breasts are extremely dense,
which lowers the sensitivity of mammography.
FINDINGS: CC and MLO views of both breasts were obtained in 2D and 3D formats.
A spot tangential view of the left breast in the area of palpable
concern was also obtained. No findings suspicious for malignancy in
either breast. Specifically, no mammographic abnormality in the area
of palpable concern.

Mammographic images were processed with CAD.

On physical exam, there is a pea-sized palpable lump in the lower
outer periareolar left breast at the approximate 8 o'clock
position..

Ultrasound is performed, showing a hypoechoic mass at the 8 o'clock
position of the left breast approximately 2 cm from the nipple
corresponding to the area of palpable concern, measuring
approximately 0.6 x 0.5 x 0.7 cm, demonstrating internal power
Doppler flow, firm on elastography consistent with a solid lesion.
There is acoustic enhancement, so this may represent a solid lesion
with within a cyst. A 0.5 cm simple cyst was identified at the 9
o'clock position approximately 4 cm from the nipple.
IMPRESSION: Solid mass at the 8 o'clock position of the left breast
approximately 2 cm from the nipple corresponding to the area of
palpable concern, likely either a benign fibroadenoma or a
papilloma.

RECOMMENDATION:
Ultrasound-guided core needle biopsy of the left breast mass. The
biopsy procedure was discussed in detail with the patient and all of
her questions were answered. She has agreed to proceed, and this has
been scheduled for [REDACTED], [DATE], at 3 o'clock p.m.

I have discussed the findings and recommendations with the patient.
Results were also provided in writing at the conclusion of the
visit.

BI-RADS CATEGORY  4: Suspicious abnormality - biopsy should be
considered.

## 2015-10-20 IMAGING — MG MM DIGITAL DIAGNOSTIC UNILAT*L*
2 series · 2 of 2 positions shown · non-contrast
Comparison: Previous exams

CLINICAL DATA: Status post ultrasound-guided core biopsy of a left
breast mass.

EXAM:
DIAGNOSTIC LEFT MAMMOGRAM POST ULTRASOUND BIOPSY

[L ML]
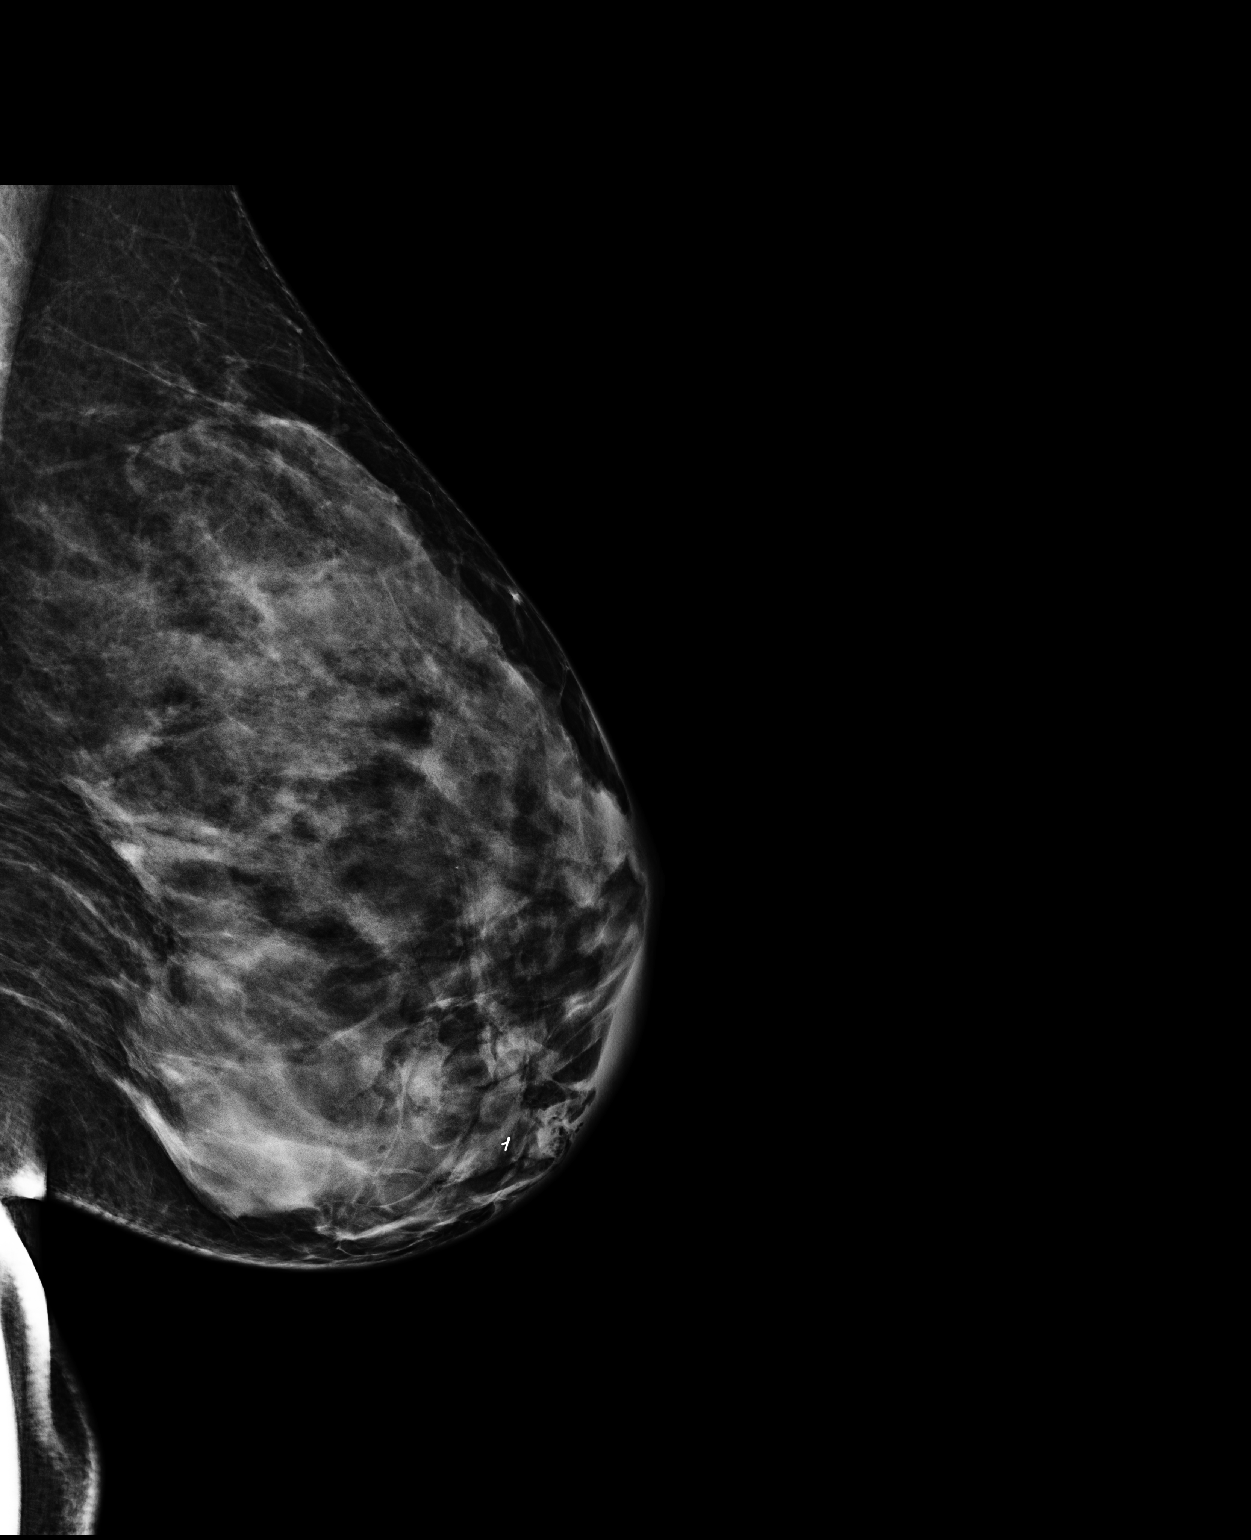

[L CC]
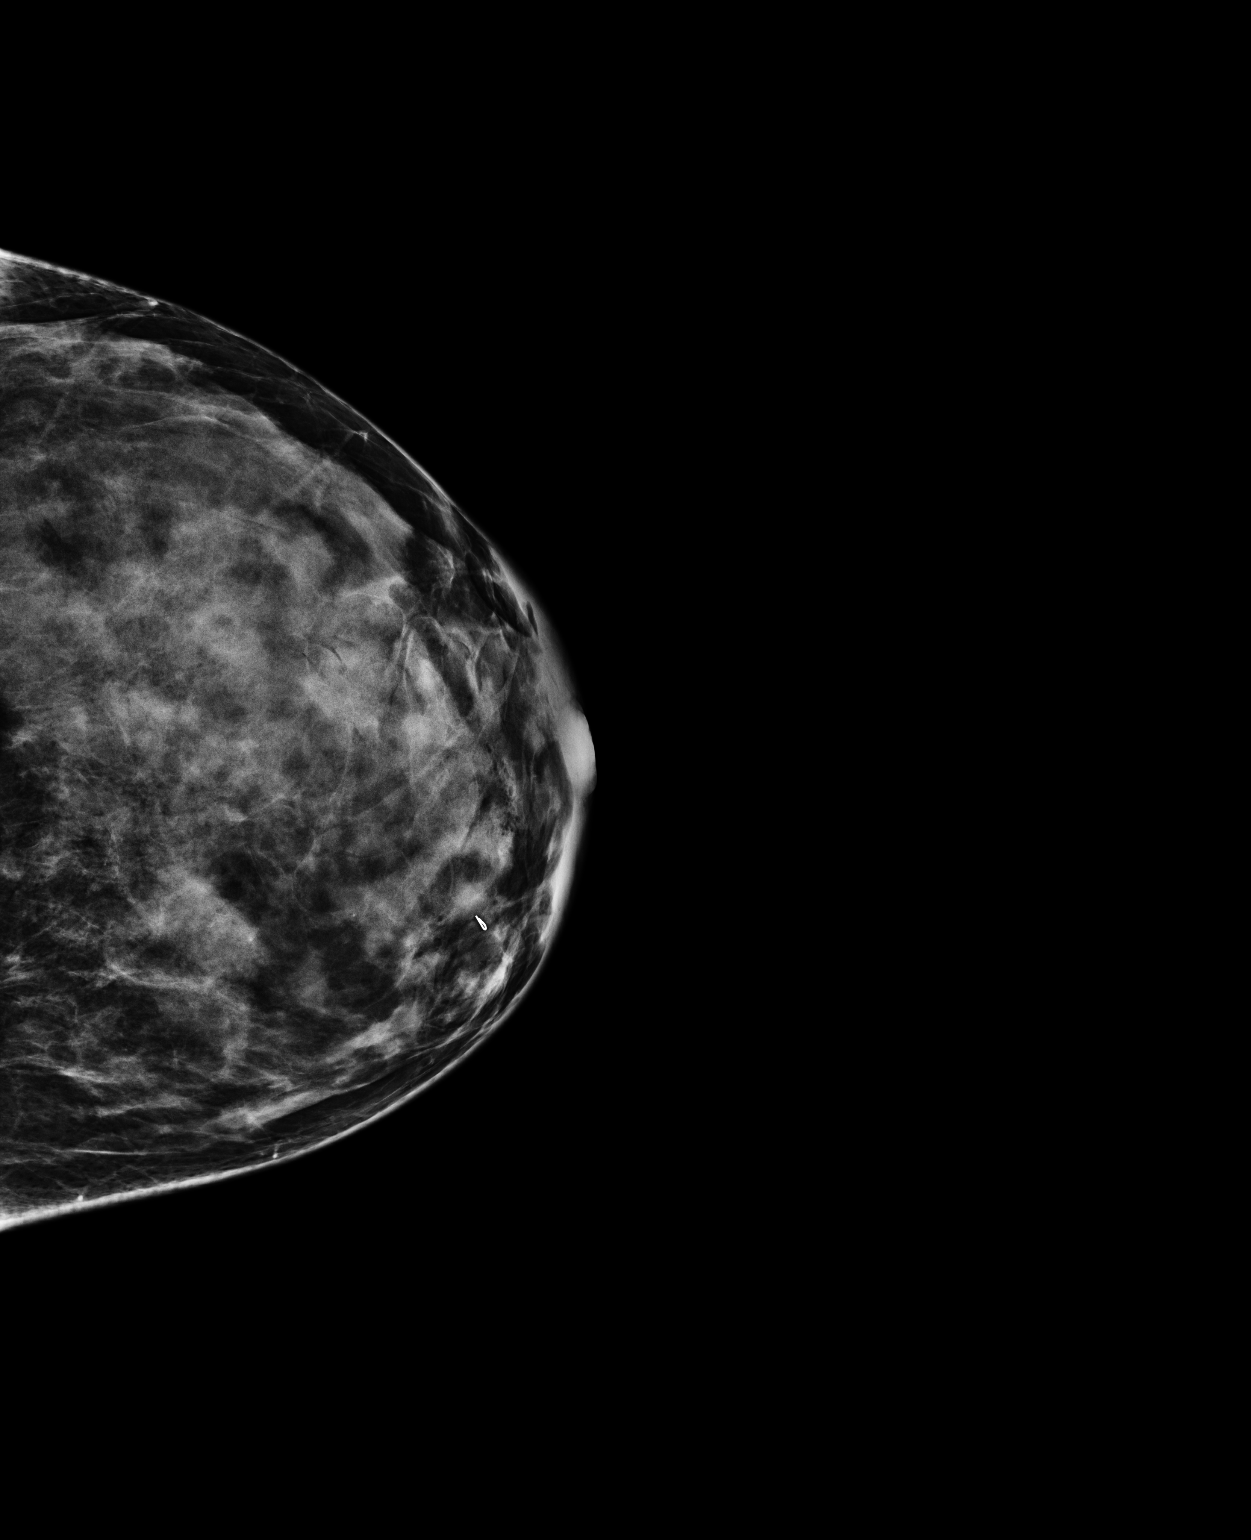

[2 of 2 positions shown; findings below may reference images not displayed]

FINDINGS: Mammographic images were obtained following ultrasound guided biopsy
of mass in the 8 o'clock region of the left breast. Mammographic
images show there is a ribbon shaped clip in the medial aspect of
the left breast.
IMPRESSION: Status post ultrasound-guided core biopsy of a left breast with
pathology pending.

Final Assessment: Post Procedure Mammograms for Marker Placement
